# Patient Record
Sex: Male | Born: 1985
Health system: Southern US, Community
[De-identification: ages and names within clinical notes are randomized; demographics above are authoritative.]

## PROBLEM LIST (undated history)

## (undated) DIAGNOSIS — E559 Vitamin D deficiency, unspecified: Secondary | ICD-10-CM

## (undated) DIAGNOSIS — G4730 Sleep apnea, unspecified: Secondary | ICD-10-CM

## (undated) DIAGNOSIS — E7800 Pure hypercholesterolemia, unspecified: Secondary | ICD-10-CM

## (undated) HISTORY — DX: Sleep apnea, unspecified: G47.30

## (undated) HISTORY — PX: WISDOM TOOTH EXTRACTION: SHX21

---

## 2017-12-22 DIAGNOSIS — R509 Fever, unspecified: Secondary | ICD-10-CM | POA: Diagnosis not present

## 2017-12-22 DIAGNOSIS — J189 Pneumonia, unspecified organism: Secondary | ICD-10-CM | POA: Diagnosis not present

## 2017-12-22 DIAGNOSIS — R05 Cough: Secondary | ICD-10-CM | POA: Diagnosis not present

## 2018-08-30 DIAGNOSIS — J209 Acute bronchitis, unspecified: Secondary | ICD-10-CM | POA: Diagnosis not present

## 2018-08-30 DIAGNOSIS — J0110 Acute frontal sinusitis, unspecified: Secondary | ICD-10-CM | POA: Diagnosis not present

## 2019-03-08 DIAGNOSIS — Z20828 Contact with and (suspected) exposure to other viral communicable diseases: Secondary | ICD-10-CM | POA: Diagnosis not present

## 2019-05-27 ENCOUNTER — Emergency Department (HOSPITAL_BASED_OUTPATIENT_CLINIC_OR_DEPARTMENT_OTHER): Payer: BC Managed Care – PPO

## 2019-05-27 ENCOUNTER — Encounter (HOSPITAL_BASED_OUTPATIENT_CLINIC_OR_DEPARTMENT_OTHER): Payer: Self-pay

## 2019-05-27 ENCOUNTER — Emergency Department (HOSPITAL_BASED_OUTPATIENT_CLINIC_OR_DEPARTMENT_OTHER)
Admission: EM | Admit: 2019-05-27 | Discharge: 2019-05-27 | Disposition: A | Discharge status: Home / Self Care | Payer: BC Managed Care – PPO | Attending: Emergency Medicine | Admitting: Emergency Medicine

## 2019-05-27 ENCOUNTER — Other Ambulatory Visit: Payer: Self-pay

## 2019-05-27 DIAGNOSIS — Z20828 Contact with and (suspected) exposure to other viral communicable diseases: Secondary | ICD-10-CM | POA: Insufficient documentation

## 2019-05-27 DIAGNOSIS — Z03818 Encounter for observation for suspected exposure to other biological agents ruled out: Secondary | ICD-10-CM | POA: Diagnosis not present

## 2019-05-27 DIAGNOSIS — R1084 Generalized abdominal pain: Secondary | ICD-10-CM | POA: Diagnosis not present

## 2019-05-27 DIAGNOSIS — K760 Fatty (change of) liver, not elsewhere classified: Secondary | ICD-10-CM | POA: Diagnosis not present

## 2019-05-27 HISTORY — DX: Vitamin D deficiency, unspecified: E55.9

## 2019-05-27 HISTORY — DX: Pure hypercholesterolemia, unspecified: E78.00

## 2019-05-27 LAB — URINALYSIS, ROUTINE W REFLEX MICROSCOPIC
Bilirubin Urine: NEGATIVE
Glucose, UA: NEGATIVE mg/dL
Hgb urine dipstick: NEGATIVE
Ketones, ur: NEGATIVE mg/dL
Leukocytes,Ua: NEGATIVE
Nitrite: NEGATIVE
Protein, ur: NEGATIVE mg/dL
Specific Gravity, Urine: 1.02 (ref 1.005–1.030)
pH: 7.5 (ref 5.0–8.0)

## 2019-05-27 LAB — CBC WITH DIFFERENTIAL/PLATELET
Abs Immature Granulocytes: 0.02 10*3/uL (ref 0.00–0.07)
Basophils Absolute: 0 10*3/uL (ref 0.0–0.1)
Basophils Relative: 0 %
Eosinophils Absolute: 0 10*3/uL (ref 0.0–0.5)
Eosinophils Relative: 1 %
HCT: 44.6 % (ref 39.0–52.0)
Hemoglobin: 14.1 g/dL (ref 13.0–17.0)
Immature Granulocytes: 0 %
Lymphocytes Relative: 12 %
Lymphs Abs: 0.8 10*3/uL (ref 0.7–4.0)
MCH: 27.3 pg (ref 26.0–34.0)
MCHC: 31.6 g/dL (ref 30.0–36.0)
MCV: 86.3 fL (ref 80.0–100.0)
Monocytes Absolute: 0.4 10*3/uL (ref 0.1–1.0)
Monocytes Relative: 6 %
Neutro Abs: 5.1 10*3/uL (ref 1.7–7.7)
Neutrophils Relative %: 81 %
Platelets: 241 10*3/uL (ref 150–400)
RBC: 5.17 MIL/uL (ref 4.22–5.81)
RDW: 13.5 % (ref 11.5–15.5)
WBC: 6.3 10*3/uL (ref 4.0–10.5)
nRBC: 0 % (ref 0.0–0.2)

## 2019-05-27 LAB — COMPREHENSIVE METABOLIC PANEL
ALT: 44 U/L (ref 0–44)
AST: 23 U/L (ref 15–41)
Albumin: 4.4 g/dL (ref 3.5–5.0)
Alkaline Phosphatase: 57 U/L (ref 38–126)
Anion gap: 11 (ref 5–15)
BUN: 18 mg/dL (ref 6–20)
CO2: 26 mmol/L (ref 22–32)
Calcium: 9.5 mg/dL (ref 8.9–10.3)
Chloride: 101 mmol/L (ref 98–111)
Creatinine, Ser: 1.26 mg/dL — ABNORMAL HIGH (ref 0.61–1.24)
GFR calc Af Amer: >60 mL/min (ref >60)
GFR calc non Af Amer: >60 mL/min (ref >60)
Glucose, Bld: 111 mg/dL — ABNORMAL HIGH (ref 70–99)
Potassium: 3.9 mmol/L (ref 3.5–5.1)
Sodium: 138 mmol/L (ref 135–145)
Total Bilirubin: 0.6 mg/dL (ref 0.3–1.2)
Total Protein: 7.8 g/dL (ref 6.5–8.1)

## 2019-05-27 LAB — SARS CORONAVIRUS 2 (TAT 6-24 HRS): SARS Coronavirus 2: NEGATIVE

## 2019-05-27 LAB — LIPASE, BLOOD: Lipase: 18 U/L (ref 11–51)

## 2019-05-27 MED ORDER — SODIUM CHLORIDE 0.9 % IV BOLUS
1000.0000 mL | Freq: Once | INTRAVENOUS | Status: AC
  Administered 2019-05-27: 1000 mL via INTRAVENOUS

## 2019-05-27 MED ORDER — DICYCLOMINE HCL 20 MG PO TABS: 20.0000 mg | ORAL_TABLET | Freq: Two times a day (BID) | ORAL | 0 refills | Status: DC

## 2019-05-27 MED ORDER — IOHEXOL 300 MG/ML  SOLN
100.0000 mL | Freq: Once | INTRAMUSCULAR | Status: AC | PRN
  Administered 2019-05-27: 15:00:00 100 mL via INTRAVENOUS

## 2019-05-27 MED ORDER — ONDANSETRON 4 MG PO TBDP: 4.0000 mg | ORAL_TABLET | Freq: Three times a day (TID) | ORAL | 0 refills | Status: DC | PRN

## 2019-05-27 MED ORDER — MORPHINE SULFATE (PF) 4 MG/ML IV SOLN
4.0000 mg | Freq: Once | INTRAVENOUS | Status: AC
  Administered 2019-05-27: 4 mg via INTRAVENOUS
  Filled 2019-05-27: qty 1

## 2019-05-27 MED ORDER — ONDANSETRON HCL 4 MG/2ML IJ SOLN
4.0000 mg | Freq: Once | INTRAMUSCULAR | Status: AC
  Administered 2019-05-27: 13:00:00 4 mg via INTRAVENOUS
  Filled 2019-05-27: qty 2

## 2019-05-27 MED FILL — DICYCLOMINE 20 MG TABLET: 20 | 10 days supply | Qty: 20 | Fill #0

## 2019-05-27 MED FILL — ONDANSETRON ODT 4 MG TABLET: 4 | 3 days supply | Qty: 10 | Fill #0

## 2019-05-27 NOTE — ED Triage Notes (Signed)
C/o abd pain to umbilicus area-nausea, diarrhea-sx started  8/28-NAD-steady gait

## 2019-05-27 NOTE — ED Notes (Signed)
ED Provider at bedside. 

## 2019-05-27 NOTE — ED Provider Notes (Signed)
[facility] EMERGENCY DEPARTMENT Provider Note   CSN: [id number] Arrival date & time: 05/27/19  1247     History   Chief Complaint Chief Complaint  Patient presents with  . Abdominal Pain    HPI Adrian Torres is a 33 y.o. male.     Pt presents to the ED today with abdominal pain.  Sx started on Friday, August 28.  It was not too bad on the 29th, but came back on the 30th and is worse today.  He has nausea, but no vomiting.  No fever.  He took an imodium today which made it worse.     Past Medical History:  Diagnosis Date  . High cholesterol   . Vitamin D deficiency     There are no active problems to display for this patient.   History reviewed. No pertinent surgical history.      Home Medications    Prior to Admission medications   Medication Sig Start Date End Date Taking? Authorizing Provider  dicyclomine (BENTYL) 20 MG tablet Take 1 tablet (20 mg total) by mouth 2 (two) times daily. 05/27/19   Jacalyn LefevreHaviland, Yousif Edelson, MD  ondansetron (ZOFRAN ODT) 4 MG disintegrating tablet Take 1 tablet (4 mg total) by mouth every 8 (eight) hours as needed. 05/27/19   Jacalyn LefevreHaviland, Remmy Riffe, MD    Family History No family history on file.  Social History Social History   Tobacco Use  . Smoking status: Never Smoker  . Smokeless tobacco: Never Used  Substance Use Topics  . Alcohol use: Never    Frequency: Never  . Drug use: Never     Allergies   Penicillins   Review of Systems Review of Systems  Gastrointestinal: Positive for abdominal pain and nausea.  All other systems reviewed and are negative.    Physical Exam Updated Vital Signs BP 120/88 (BP Location: Left Arm)   Pulse 88   Temp 97.8 F (36.6 C) (Oral)   Resp 18   Ht 5\' 10"  (1.778 m)   Wt 115.2 kg   SpO2 100%   BMI 36.45 kg/m   Physical Exam Vitals signs and nursing note reviewed.  Constitutional:      Appearance: He is well-developed.  HENT:     Head: Normocephalic and atraumatic.   Mouth/Throat:     Mouth: Mucous membranes are moist.     Pharynx: Oropharynx is clear.  Eyes:     Extraocular Movements: Extraocular movements intact.     Pupils: Pupils are equal, round, and reactive to light.  Cardiovascular:     Rate and Rhythm: Normal rate and regular rhythm.     Heart sounds: Normal heart sounds.  Pulmonary:     Effort: Pulmonary effort is normal.     Breath sounds: Normal breath sounds.  Abdominal:     General: Abdomen is flat.     Palpations: Abdomen is soft.     Tenderness: There is abdominal tenderness in the periumbilical area.  Skin:    General: Skin is warm.     Capillary Refill: Capillary refill takes less than 2 seconds.  Neurological:     General: No focal deficit present.     Mental Status: He is alert and oriented to person, place, and time.  Psychiatric:        Mood and Affect: Mood normal.        Behavior: Behavior normal.      ED Treatments / Results  Labs (all labs ordered are listed, but only abnormal  results are displayed) Labs Reviewed  COMPREHENSIVE METABOLIC PANEL - Abnormal; Notable for the following components:      Result Value   Glucose, Bld 111 (*)    Creatinine, Ser 1.26 (*)    All other components within normal limits  SARS CORONAVIRUS 2 (TAT 6-24 HRS)  CBC WITH DIFFERENTIAL/PLATELET  LIPASE, BLOOD  URINALYSIS, ROUTINE W REFLEX MICROSCOPIC    EKG None  Radiology Ct Abdomen Pelvis W Contrast  Result Date: 05/27/2019 CLINICAL DATA:  Generalized abdominal pain nausea EXAM: CT ABDOMEN AND PELVIS WITH CONTRAST TECHNIQUE: Multidetector CT imaging of the abdomen and pelvis was performed using the standard protocol following bolus administration of intravenous contrast. CONTRAST:  168mL OMNIPAQUE IOHEXOL 300 MG/ML  SOLN COMPARISON:  None. FINDINGS: Lower chest: No acute abnormality. Hepatobiliary: Heterogenous decreased attenuation consistent with fatty infiltration. Sparing near the gallbladder fossa. Small amount of density  within the gallbladder lumen without calcified stone. Pancreas: Unremarkable. No pancreatic ductal dilatation or surrounding inflammatory changes. Spleen: Normal in size without focal abnormality. Adrenals/Urinary Tract: Adrenal glands are unremarkable. Kidneys are normal, without renal calculi, focal lesion, or hydronephrosis. Bladder is unremarkable. Stomach/Bowel: Stomach is within normal limits. Appendix appears normal. No evidence of bowel wall thickening, distention, or inflammatory changes. Vascular/Lymphatic: No significant vascular findings are present. No enlarged abdominal or pelvic lymph nodes. Reproductive: Prostate is unremarkable. Other: No abdominal wall hernia or abnormality. No abdominopelvic ascites. Musculoskeletal: No acute or significant osseous findings. IMPRESSION: 1. No CT evidence for acute intra-abdominal or pelvic abnormality. 2. Hepatic steatosis with sparing near the gallbladder fossa 3. Ovoid noncalcified density within the gallbladder, possible sludge versus polyp. Ultrasound could be obtained for further characterisation, this could be performed on a nonemergent basis if no clinical suspicion for acute gallbladder disease. Electronically Signed   By: Donavan Foil M.D.   On: 05/27/2019 14:49    Procedures Procedures (including critical care time)  Medications Ordered in ED Medications  morphine 4 MG/ML injection 4 mg (4 mg Intravenous Given 05/27/19 1326)  ondansetron (ZOFRAN) injection 4 mg (4 mg Intravenous Given 05/27/19 1326)  sodium chloride 0.9 % bolus 1,000 mL (0 mLs Intravenous Stopped 05/27/19 1429)  iohexol (OMNIPAQUE) 300 MG/ML solution 100 mL (100 mLs Intravenous Contrast Given 05/27/19 1433)     Initial Impression / Assessment and Plan / ED Course  I have reviewed the triage vital signs and the nursing notes.  Pertinent labs & imaging results that were available during my care of the patient were reviewed by me and considered in my medical decision making (see  chart for details).     Pt is feeling much better.  He is swabbed for Covid prior to d/c.  Return if worse.    Final Clinical Impressions(s) / ED Diagnoses   Final diagnoses:  Generalized abdominal pain    ED Discharge Orders         Ordered    ondansetron (ZOFRAN ODT) 4 MG disintegrating tablet  Every 8 hours PRN     05/27/19 1508    dicyclomine (BENTYL) 20 MG tablet  2 times daily     05/27/19 1508           Isla Pence, MD 05/27/19 [phone]

## 2019-05-30 ENCOUNTER — Other Ambulatory Visit: Payer: Self-pay

## 2019-05-30 ENCOUNTER — Emergency Department (HOSPITAL_COMMUNITY)
Admission: EM | Admit: 2019-05-30 | Discharge: 2019-05-31 | Disposition: A | Discharge status: Home / Self Care | Payer: BC Managed Care – PPO | Attending: Emergency Medicine | Admitting: Emergency Medicine

## 2019-05-30 ENCOUNTER — Encounter (HOSPITAL_COMMUNITY): Payer: Self-pay

## 2019-05-30 ENCOUNTER — Emergency Department (HOSPITAL_COMMUNITY): Payer: BC Managed Care – PPO

## 2019-05-30 DIAGNOSIS — R079 Chest pain, unspecified: Secondary | ICD-10-CM | POA: Diagnosis not present

## 2019-05-30 DIAGNOSIS — R1011 Right upper quadrant pain: Secondary | ICD-10-CM

## 2019-05-30 DIAGNOSIS — R1084 Generalized abdominal pain: Secondary | ICD-10-CM | POA: Diagnosis not present

## 2019-05-30 DIAGNOSIS — K8020 Calculus of gallbladder without cholecystitis without obstruction: Secondary | ICD-10-CM | POA: Insufficient documentation

## 2019-05-30 DIAGNOSIS — K760 Fatty (change of) liver, not elsewhere classified: Secondary | ICD-10-CM | POA: Diagnosis not present

## 2019-05-30 LAB — COMPREHENSIVE METABOLIC PANEL
ALT: 42 U/L (ref 0–44)
AST: 20 U/L (ref 15–41)
Albumin: 4.7 g/dL (ref 3.5–5.0)
Alkaline Phosphatase: 61 U/L (ref 38–126)
Anion gap: 10 (ref 5–15)
BUN: 21 mg/dL — ABNORMAL HIGH (ref 6–20)
CO2: 26 mmol/L (ref 22–32)
Calcium: 9.1 mg/dL (ref 8.9–10.3)
Chloride: 101 mmol/L (ref 98–111)
Creatinine, Ser: 1.1 mg/dL (ref 0.61–1.24)
GFR calc Af Amer: >60 mL/min (ref >60)
GFR calc non Af Amer: >60 mL/min (ref >60)
Glucose, Bld: 100 mg/dL — ABNORMAL HIGH (ref 70–99)
Potassium: 3.4 mmol/L — ABNORMAL LOW (ref 3.5–5.1)
Sodium: 137 mmol/L (ref 135–145)
Total Bilirubin: 0.9 mg/dL (ref 0.3–1.2)
Total Protein: 8.2 g/dL — ABNORMAL HIGH (ref 6.5–8.1)

## 2019-05-30 LAB — CBC
HCT: 44.4 % (ref 39.0–52.0)
Hemoglobin: 14.2 g/dL (ref 13.0–17.0)
MCH: 27.6 pg (ref 26.0–34.0)
MCHC: 32 g/dL (ref 30.0–36.0)
MCV: 86.2 fL (ref 80.0–100.0)
Platelets: 266 10*3/uL (ref 150–400)
RBC: 5.15 MIL/uL (ref 4.22–5.81)
RDW: 13.3 % (ref 11.5–15.5)
WBC: 6.5 10*3/uL (ref 4.0–10.5)
nRBC: 0 % (ref 0.0–0.2)

## 2019-05-30 LAB — LIPASE, BLOOD: Lipase: 17 U/L (ref 11–51)

## 2019-05-30 MED ORDER — FENTANYL CITRATE (PF) 100 MCG/2ML IJ SOLN
100.0000 ug | Freq: Once | INTRAMUSCULAR | Status: AC
  Administered 2019-05-31: 100 ug via INTRAVENOUS
  Filled 2019-05-30: qty 2

## 2019-05-30 MED ORDER — SODIUM CHLORIDE 0.9% FLUSH
3.0000 mL | Freq: Once | INTRAVENOUS | Status: AC
  Administered 2019-05-31: 3 mL via INTRAVENOUS

## 2019-05-30 MED ORDER — ONDANSETRON HCL 4 MG/2ML IJ SOLN
4.0000 mg | Freq: Once | INTRAMUSCULAR | Status: AC
  Administered 2019-05-31: 4 mg via INTRAVENOUS
  Filled 2019-05-30: qty 2

## 2019-05-30 NOTE — ED Triage Notes (Signed)
Pt reports that he was dx'd with gallbladder sludge earlier this week. He reports continued abdominal pain and states that now the pain has migrated to his epigastric region. Denies vomiting, but endorses nausea. Reports diarrhea all week. A&Ox4.

## 2019-05-31 DIAGNOSIS — K760 Fatty (change of) liver, not elsewhere classified: Secondary | ICD-10-CM | POA: Diagnosis not present

## 2019-05-31 DIAGNOSIS — K8020 Calculus of gallbladder without cholecystitis without obstruction: Secondary | ICD-10-CM | POA: Diagnosis not present

## 2019-05-31 MED ORDER — ONDANSETRON 8 MG PO TBDP: 8.0000 mg | ORAL_TABLET | Freq: Three times a day (TID) | ORAL | 0 refills | Status: DC | PRN

## 2019-05-31 MED ORDER — HYDROCODONE-ACETAMINOPHEN 5-325 MG PO TABS: 1.0000 | ORAL_TABLET | Freq: Four times a day (QID) | ORAL | 0 refills | Status: DC | PRN

## 2019-05-31 NOTE — ED Notes (Signed)
US in room 

## 2019-05-31 NOTE — ED Provider Notes (Signed)
[facility] DEPT Provider Note   CSN: [id number] Arrival date & time: 05/30/19  1921     History   Chief Complaint Chief Complaint  Patient presents with  . Abdominal Pain    HPI Adrian Torres is a 33 y.o. male.     The history is provided by the patient.  Abdominal Pain Pain location:  Generalized Pain quality: aching   Pain severity:  Moderate Onset quality:  Gradual Duration:  7 days Timing:  Constant Progression:  Worsening Chronicity:  New Relieved by:  Nothing Worsened by:  Movement and palpation Associated symptoms: diarrhea and nausea   Associated symptoms: no fever and no hematochezia   Risk factors: has not had multiple surgeries   Patient reports pain started a week ago.  He reports loose diarrhea that is nonbloody.  No fevers.  No recent travel.  No insect bites.  No Camping. Seen in ER recently had a CT scan and told he had gallbladder sludge.  Reports pain continued since that time. Past Medical History:  Diagnosis Date  . High cholesterol   . Vitamin D deficiency     There are no active problems to display for this patient.   History reviewed. No pertinent surgical history.      Home Medications    Prior to Admission medications   Medication Sig Start Date End Date Taking? Authorizing Provider  calcium carbonate (TUMS - DOSED IN MG ELEMENTAL CALCIUM) 500 MG chewable tablet Chew 1 tablet by mouth daily as needed for indigestion or heartburn.   Yes [provider]  dicyclomine (BENTYL) 20 MG tablet Take 1 tablet (20 mg total) by mouth 2 (two) times daily. 05/27/19  Yes Isla Pence, MD  Misc Natural Products (APPLE CIDER VINEGAR DIET PO) Take 1 tablet by mouth daily.   Yes [provider]  ondansetron (ZOFRAN ODT) 4 MG disintegrating tablet Take 1 tablet (4 mg total) by mouth every 8 (eight) hours as needed. Patient taking differently: Take 4 mg by mouth every 8 (eight) hours as needed for nausea  or vomiting.  05/27/19  Yes Isla Pence, MD    Family History History reviewed. No pertinent family history.  Social History Social History   Tobacco Use  . Smoking status: Never Smoker  . Smokeless tobacco: Never Used  Substance Use Topics  . Alcohol use: Never    Frequency: Never  . Drug use: Never     Allergies   Penicillins   Review of Systems Review of Systems  Constitutional: Negative for fever.  Cardiovascular:       Brief chest pain  Gastrointestinal: Positive for abdominal pain, diarrhea and nausea. Negative for blood in stool and hematochezia.  All other systems reviewed and are negative.    Physical Exam Updated Vital Signs BP 116/81   Pulse 87   Temp 98.5 F (36.9 C) (Oral)   Resp 18   SpO2 99%   Physical Exam CONSTITUTIONAL: Well developed/well nourished HEAD: Normocephalic/atraumatic EYES: EOMI/PERRL, no icterus ENMT: Mucous membranes moist NECK: supple no meningeal signs SPINE/BACK:entire spine nontender CV: S1/S2 noted, no murmurs/rubs/gallops noted LUNGS: Lungs are clear to auscultation bilaterally, no apparent distress ABDOMEN: soft, moderate RUQ tenderness, no rebound or guarding, bowel sounds noted throughout abdomen GU:no cva tenderness NEURO: Pt is awake/alert/appropriate, moves all extremitiesx4.  No facial droop.   EXTREMITIES: pulses normal/equal, full ROM SKIN: warm, color normal PSYCH: no abnormalities of mood noted, alert and oriented to situation   ED Treatments / Results  Labs (all labs ordered are listed, but only abnormal results are displayed) Labs Reviewed  COMPREHENSIVE METABOLIC PANEL - Abnormal; Notable for the following components:      Result Value   Potassium 3.4 (*)    Glucose, Bld 100 (*)    BUN 21 (*)    Total Protein 8.2 (*)    All other components within normal limits  LIPASE, BLOOD  CBC  URINALYSIS, ROUTINE W REFLEX MICROSCOPIC    EKG EKG Interpretation  Date/Time:  Saturday May 31 2019  00:23:12 EDT Ventricular Rate:  63 PR Interval:    QRS Duration: 83 QT Interval:  403 QTC Calculation: 413 R Axis:   60 Text Interpretation:  Sinus rhythm ST elev, probable normal early repol pattern No previous ECGs available Confirmed by Zadie RhineWickline, Qadir Folks ([id number]) on 05/31/2019 12:43:26 AM   Radiology [place] Abdomen Limited Ruq  Result Date: 05/31/2019 CLINICAL DATA:  Right upper quadrant pain EXAM: ULTRASOUND ABDOMEN LIMITED RIGHT UPPER QUADRANT COMPARISON:  CT 05/27/2019 FINDINGS: Gallbladder: 1.6 cm mobile gallstone within the gallbladder. No wall thickening or sonographic Murphy's sign. Common bile duct: Diameter: Normal caliber, 4 mm Liver: Increased echotexture compatible with fatty infiltration. No focal abnormality or biliary ductal dilatation. Portal vein is patent on color Doppler imaging with normal direction of blood flow towards the liver. Other: None. IMPRESSION: Fatty infiltration of the liver. Cholelithiasis.  No sonographic evidence of acute cholecystitis. Electronically Signed   By: Charlett NoseKevin  Dover M.D.   On: 05/31/2019 00:21    Procedures Procedures    Medications Ordered in ED Medications  sodium chloride flush (NS) 0.9 % injection 3 mL (3 mLs Intravenous Given 05/31/19 0019)  fentaNYL (SUBLIMAZE) injection 100 mcg (100 mcg Intravenous Given 05/31/19 0018)  ondansetron (ZOFRAN) injection 4 mg (4 mg Intravenous Given 05/31/19 0018)     Initial Impression / Assessment and Plan / ED Course  I have reviewed the triage vital signs and the nursing notes.  Pertinent labs   results that were available during my care of the patient were reviewed by me and considered in my medical decision making (see chart for details).        Patient found to have cholelithiasis.  He is improved.  Labs reassuring.  He feels comfortable for discharge home.  Will refer to general surgery.  Discussed dietary modifications and strict ER return precautions  Final Clinical Impressions(s) / ED Diagnoses    Final diagnoses:  RUQ pain  Calculus of gallbladder without cholecystitis without obstruction    ED Discharge Orders         Ordered    HYDROcodone-acetaminophen (NORCO/VICODIN) 5-325 MG tablet  Every 6 hours PRN     05/31/19 0124    ondansetron (ZOFRAN ODT) 8 MG disintegrating tablet  Every 8 hours PRN     05/31/19 0124           Zadie RhineWickline, Trystyn Sitts, MD 05/31/19 [phone]

## 2019-06-04 DIAGNOSIS — G4730 Sleep apnea, unspecified: Secondary | ICD-10-CM | POA: Diagnosis not present

## 2019-06-04 DIAGNOSIS — E6609 Other obesity due to excess calories: Secondary | ICD-10-CM | POA: Diagnosis not present

## 2019-06-04 DIAGNOSIS — B353 Tinea pedis: Secondary | ICD-10-CM | POA: Diagnosis not present

## 2019-06-17 DIAGNOSIS — G4733 Obstructive sleep apnea (adult) (pediatric): Secondary | ICD-10-CM | POA: Diagnosis not present

## 2019-07-04 DIAGNOSIS — G4730 Sleep apnea, unspecified: Secondary | ICD-10-CM | POA: Diagnosis not present

## 2019-07-11 DIAGNOSIS — H5213 Myopia, bilateral: Secondary | ICD-10-CM | POA: Diagnosis not present

## 2019-08-02 DIAGNOSIS — Z20828 Contact with and (suspected) exposure to other viral communicable diseases: Secondary | ICD-10-CM | POA: Diagnosis not present

## 2019-08-19 DIAGNOSIS — Z20828 Contact with and (suspected) exposure to other viral communicable diseases: Secondary | ICD-10-CM | POA: Diagnosis not present

## 2019-10-08 DIAGNOSIS — Z20828 Contact with and (suspected) exposure to other viral communicable diseases: Secondary | ICD-10-CM | POA: Diagnosis not present

## 2020-01-13 DIAGNOSIS — G4730 Sleep apnea, unspecified: Secondary | ICD-10-CM | POA: Diagnosis not present

## 2020-01-13 DIAGNOSIS — Z8616 Personal history of COVID-19: Secondary | ICD-10-CM | POA: Diagnosis not present

## 2020-01-13 DIAGNOSIS — B353 Tinea pedis: Secondary | ICD-10-CM | POA: Diagnosis not present

## 2020-04-20 DIAGNOSIS — B353 Tinea pedis: Secondary | ICD-10-CM | POA: Diagnosis not present

## 2020-04-20 DIAGNOSIS — G4730 Sleep apnea, unspecified: Secondary | ICD-10-CM | POA: Diagnosis not present

## 2020-04-28 IMAGING — CT CT ABD-PELV W/ CM
2 of 4 series · 16 of 46 positions shown, 18 images · IV contrast (APPLIED)
Comparison: None.

CLINICAL DATA: Generalized abdominal pain nausea

EXAM:
CT ABDOMEN AND PELVIS WITH CONTRAST
TECHNIQUE: Multidetector CT imaging of the abdomen and pelvis was performed
using the standard protocol following bolus administration of
intravenous contrast.
CONTRAST:  100mL OMNIPAQUE IOHEXOL 300 MG/ML  SOLN

[Series 2: axial st · axial · 0.98mm/px · z∈[-664,-124]mm · 13 of 118 slices shown, 15 images]
[im 5/118  soft-tissue]
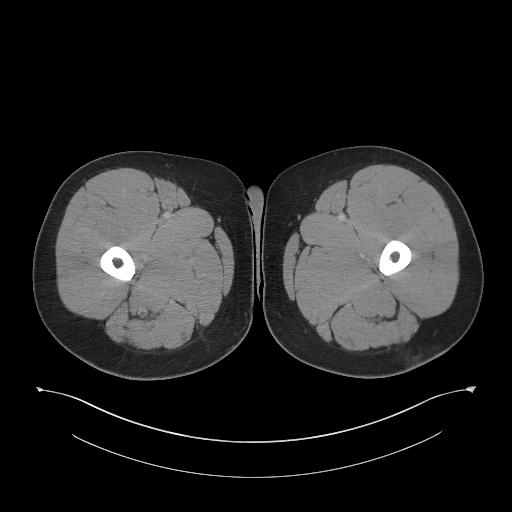
[im 5/118  bone]
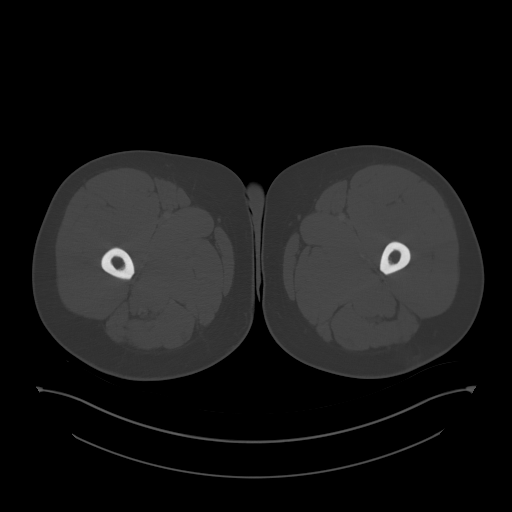
[im 15/118  soft-tissue]
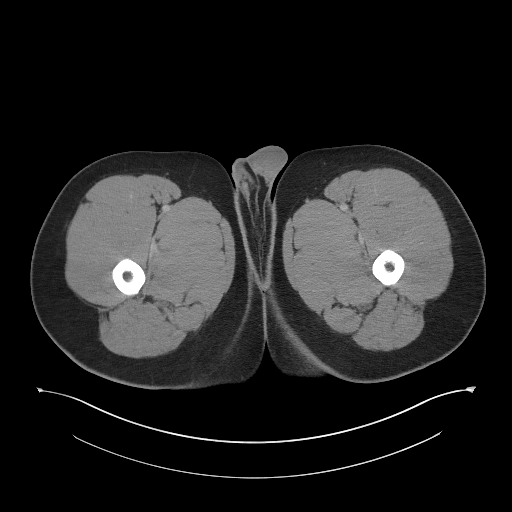
[im 25/118  soft-tissue]
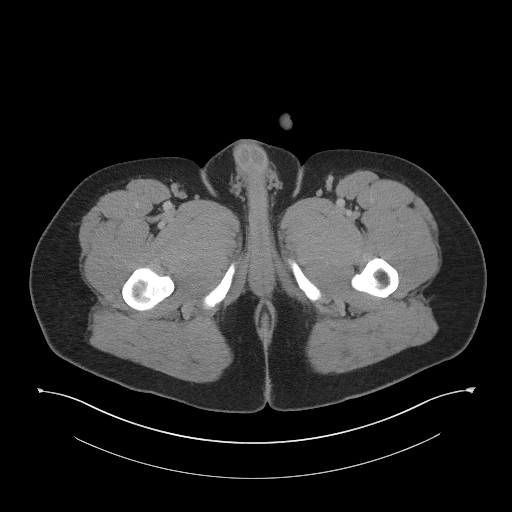
[im 35/118  soft-tissue]
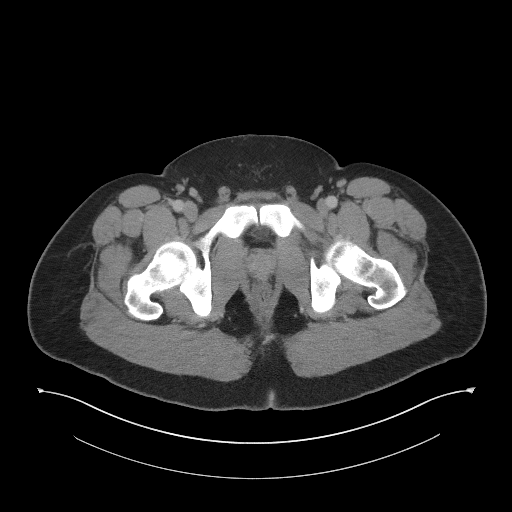
[im 40/118  soft-tissue]
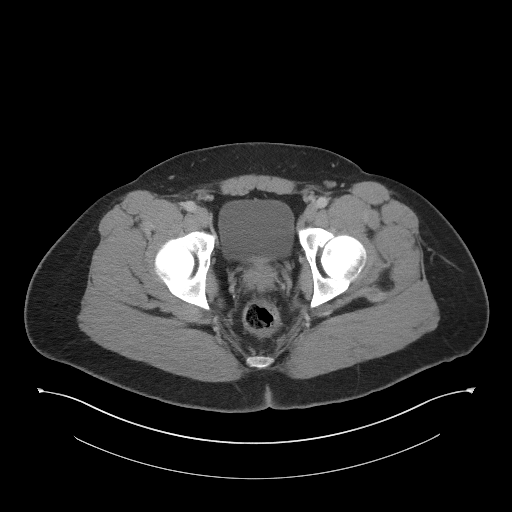
[im 49/118  soft-tissue]
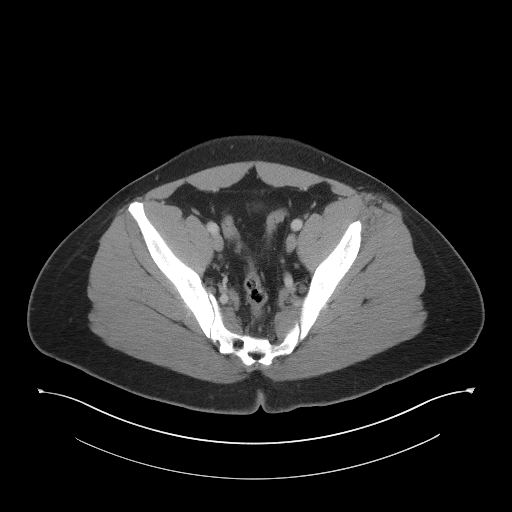
[im 59/118  soft-tissue]
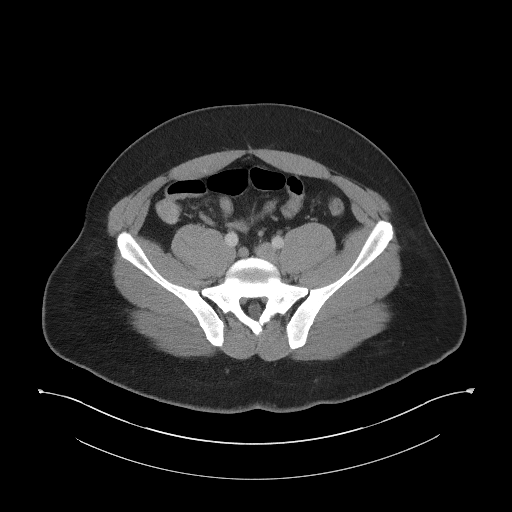
[im 69/118  soft-tissue]
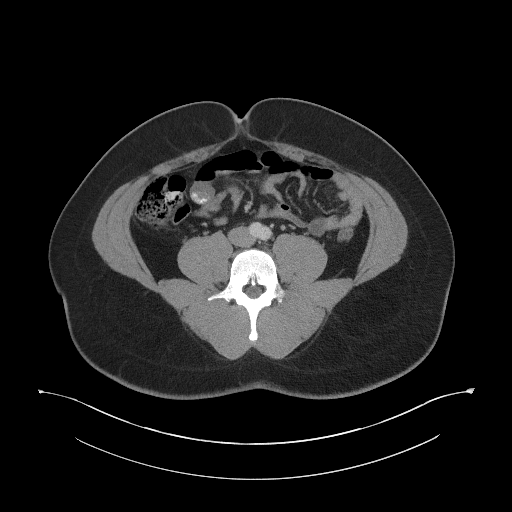
[im 79/118  soft-tissue]
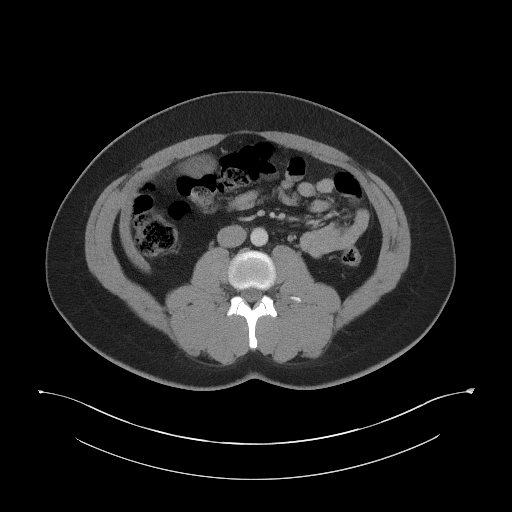
[im 79/118  bone]
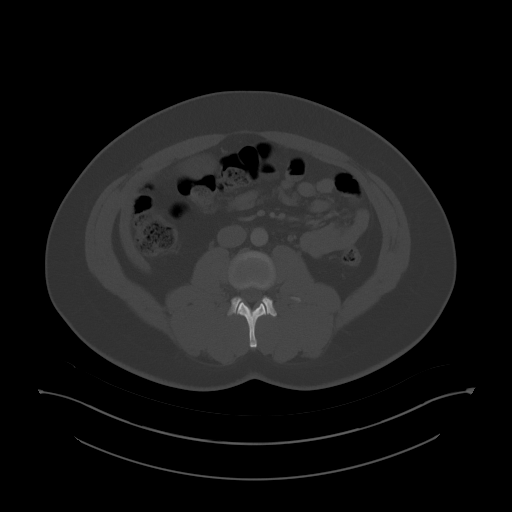
[im 83/118  soft-tissue]
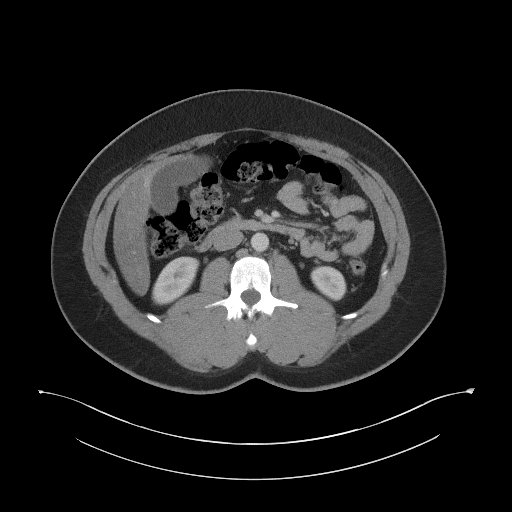
[im 93/118  soft-tissue]
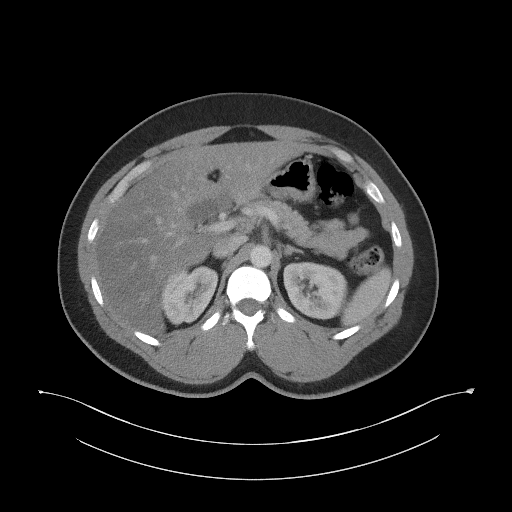
[im 103/118  soft-tissue]
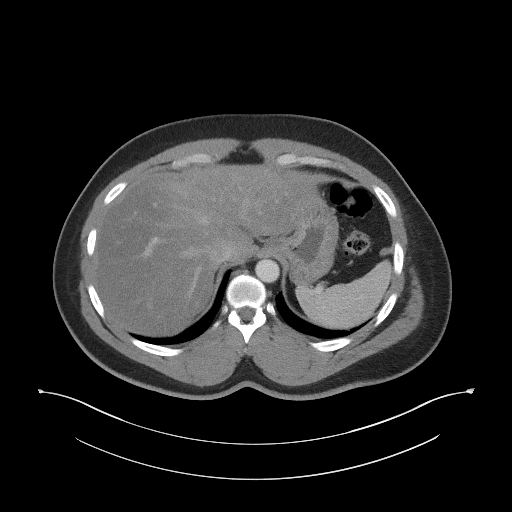
[im 113/118  soft-tissue]
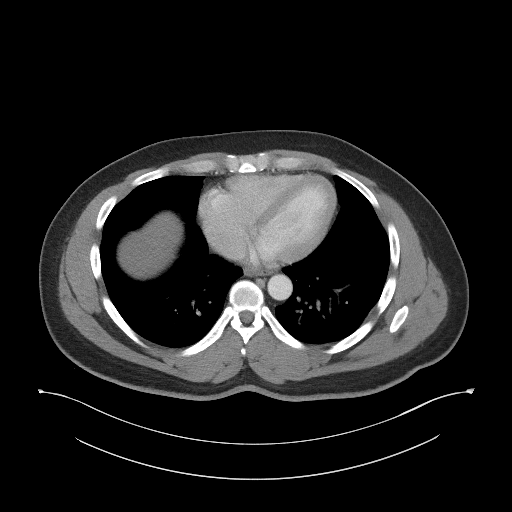

[Series 4: coronal st · coronal · 0.98mm/px · 3 of 105 slices shown]
[im 35/105  soft-tissue]
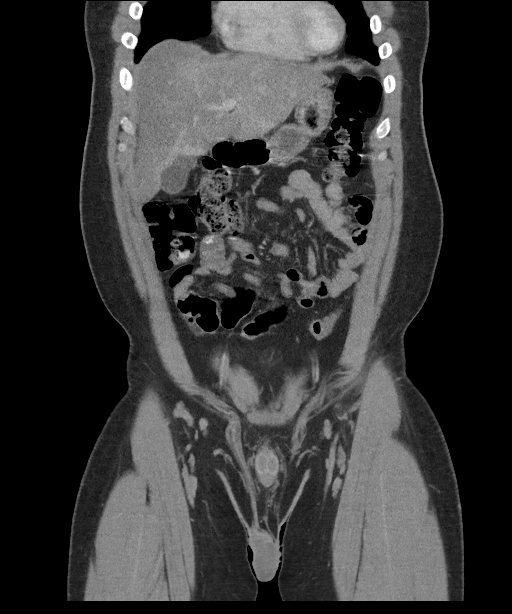
[im 47/105  soft-tissue]
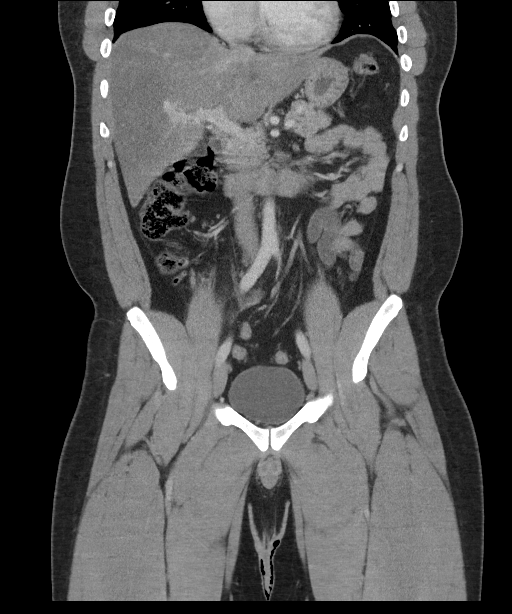
[im 58/105  soft-tissue]
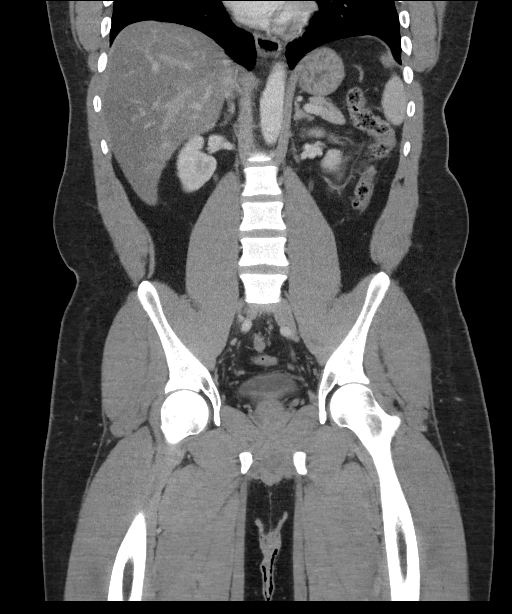

[16 of 46 positions shown; findings below may reference images not displayed]

FINDINGS: Lower chest: No acute abnormality.

Hepatobiliary: Heterogenous decreased attenuation consistent with
fatty infiltration. Sparing near the gallbladder fossa. Small amount
of density within the gallbladder lumen without calcified stone.

Pancreas: Unremarkable. No pancreatic ductal dilatation or
surrounding inflammatory changes.

Spleen: Normal in size without focal abnormality.

Adrenals/Urinary Tract: Adrenal glands are unremarkable. Kidneys are
normal, without renal calculi, focal lesion, or hydronephrosis.
Bladder is unremarkable.

Stomach/Bowel: Stomach is within normal limits. Appendix appears
normal. No evidence of bowel wall thickening, distention, or
inflammatory changes.

Vascular/Lymphatic: No significant vascular findings are present. No
enlarged abdominal or pelvic lymph nodes.

Reproductive: Prostate is unremarkable.

Other: No abdominal wall hernia or abnormality. No abdominopelvic
ascites.

Musculoskeletal: No acute or significant osseous findings.
IMPRESSION: 1. No CT evidence for acute intra-abdominal or pelvic abnormality.
2. Hepatic steatosis with sparing near the gallbladder fossa
3. Ovoid noncalcified density within the gallbladder, possible
sludge versus polyp. Ultrasound could be obtained for further
characterisation, this could be performed on a nonemergent basis if
no clinical suspicion for acute gallbladder disease.

## 2020-05-28 ENCOUNTER — Other Ambulatory Visit: Payer: BC Managed Care – PPO

## 2020-06-02 DIAGNOSIS — Z20822 Contact with and (suspected) exposure to covid-19: Secondary | ICD-10-CM | POA: Diagnosis not present

## 2020-06-02 DIAGNOSIS — Z03818 Encounter for observation for suspected exposure to other biological agents ruled out: Secondary | ICD-10-CM | POA: Diagnosis not present

## 2020-07-22 DIAGNOSIS — Z20822 Contact with and (suspected) exposure to covid-19: Secondary | ICD-10-CM | POA: Diagnosis not present

## 2020-07-22 DIAGNOSIS — Z03818 Encounter for observation for suspected exposure to other biological agents ruled out: Secondary | ICD-10-CM | POA: Diagnosis not present

## 2021-04-13 DIAGNOSIS — Z20822 Contact with and (suspected) exposure to covid-19: Secondary | ICD-10-CM | POA: Diagnosis not present

## 2021-04-16 DIAGNOSIS — Z20822 Contact with and (suspected) exposure to covid-19: Secondary | ICD-10-CM | POA: Diagnosis not present

## 2021-06-03 ENCOUNTER — Ambulatory Visit (INDEPENDENT_AMBULATORY_CARE_PROVIDER_SITE_OTHER): Payer: BC Managed Care – PPO | Admitting: Family Medicine

## 2021-06-03 ENCOUNTER — Encounter: Payer: Self-pay | Admitting: Family Medicine

## 2021-06-03 ENCOUNTER — Other Ambulatory Visit: Payer: Self-pay

## 2021-06-03 VITALS — BP 118/79 | HR 71 | Temp 97.3°F | Resp 16 | Ht 71.0 in | Wt 252.2 lb

## 2021-06-03 DIAGNOSIS — R41840 Attention and concentration deficit: Secondary | ICD-10-CM

## 2021-06-03 DIAGNOSIS — G4733 Obstructive sleep apnea (adult) (pediatric): Secondary | ICD-10-CM | POA: Diagnosis not present

## 2021-06-03 DIAGNOSIS — Z7689 Persons encountering health services in other specified circumstances: Secondary | ICD-10-CM | POA: Diagnosis not present

## 2021-06-03 DIAGNOSIS — F341 Dysthymic disorder: Secondary | ICD-10-CM | POA: Diagnosis not present

## 2021-06-03 MED ORDER — ESCITALOPRAM OXALATE 10 MG PO TABS: 10.0000 mg | ORAL_TABLET | Freq: Every day | ORAL | 0 refills | Status: DC

## 2021-06-03 NOTE — Progress Notes (Signed)
New Patient Office Visit  Subjective:  Patient ID: Adrian Torres, male    DOB: July 14, 1986  Age: 35 y.o. MRN: [id number]  CC:  Chief Complaint  Patient presents with   Establish Care    HPI Adrian Torres presents for to establish care.  Patient reports that he has been diagnosed with sleep apnea and is in need of a CPAP machine.  Results of the most recent sleep study are not available at this time to me.  Patient also reports that he is having difficulty with concentrating.  He reports that he has had similar symptoms since he was younger.  He is presently on no meds.  He would like to be tested for attention deficits.  Past Medical History:  Diagnosis Date   High cholesterol    Sleep apnea    Vitamin D deficiency     Past Surgical History:  Procedure Laterality Date   WISDOM TOOTH EXTRACTION      Family History  Problem Relation Age of Onset   Diabetes Maternal Grandmother     Social History   Socioeconomic History   Marital status: Married    Spouse name: Not on file   Number of children: Not on file   Years of education: Not on file   Highest education level: Not on file  Occupational History   Not on file  Tobacco Use   Smoking status: Never   Smokeless tobacco: Never  Vaping Use   Vaping Use: Never used  Substance and Sexual Activity   Alcohol use: Never   Drug use: Never   Sexual activity: Yes  Other Topics Concern   Not on file  Social History Narrative   Not on file   Social Determinants of Health   Financial Resource Strain: Not on file  Food Insecurity: Not on file  Transportation Needs: Not on file  Physical Activity: Not on file  Stress: Not on file  Social Connections: Not on file  Intimate Partner Violence: Not on file    ROS Review of Systems  Respiratory:  Negative for shortness of breath.   Psychiatric/Behavioral:  Positive for decreased concentration. Negative for self-injury, sleep disturbance and suicidal ideas. The patient  is not nervous/anxious.   All other systems reviewed and are negative.  Objective:   Today's Vitals: BP 118/79 (BP Location: Left Arm, Patient Position: Sitting, Cuff Size: Large)   Pulse 71   Temp (!) 97.3 F (36.3 C) (Temporal)   Resp 16   Ht 5\' 11"  (1.803 m)   Wt 252 lb 3.2 oz (114.4 kg)   SpO2 97%   BMI 35.17 kg/m   Physical Exam Vitals and nursing note reviewed.  Constitutional:      General: He is not in acute distress. Cardiovascular:     Rate and Rhythm: Normal rate and regular rhythm.  Pulmonary:     Effort: Pulmonary effort is normal.     Breath sounds: Normal breath sounds.  Abdominal:     Palpations: Abdomen is soft.     Tenderness: There is no abdominal tenderness.  Neurological:     General: No focal deficit present.     Mental Status: He is alert and oriented to person, place, and time.    Assessment & Plan:    1. Dysthymia Patient prescribed Lexapro 10 mg p.o. daily. will monitor.  2. Obstructive sleep apnea syndrome Patient to obtain most recent sleep study in order that CPAP machine may be ordered.  3. Encounter to establish  care   4. Attention and concentration deficit Referral for further testing and evaluation. - Ambulatory referral to Psychology    Outpatient Encounter Medications as of 06/03/2021  Medication Sig   escitalopram (LEXAPRO) 10 MG tablet Take 1 tablet (10 mg total) by mouth daily.   calcium carbonate (TUMS - DOSED IN MG ELEMENTAL CALCIUM) 500 MG chewable tablet Chew 1 tablet by mouth daily as needed for indigestion or heartburn.   HYDROcodone-acetaminophen (NORCO/VICODIN) 5-325 MG tablet Take 1 tablet by mouth every 6 (six) hours as needed for severe pain.   Misc Natural Products (APPLE CIDER VINEGAR DIET PO) Take 1 tablet by mouth daily.   ondansetron (ZOFRAN ODT) 8 MG disintegrating tablet Take 1 tablet (8 mg total) by mouth every 8 (eight) hours as needed.   [DISCONTINUED] dicyclomine (BENTYL) 20 MG tablet Take 1 tablet (20  mg total) by mouth 2 (two) times daily.   No facility-administered encounter medications on file as of 06/03/2021.    Follow-up: Return in about 4 weeks (around 07/01/2021) for follow up.   Tommie Raymond, MD

## 2021-06-03 NOTE — Progress Notes (Signed)
Patient is here to est care with provider. Patient said he need a cpap machine Patient has a hard time focusing

## 2021-07-01 ENCOUNTER — Ambulatory Visit: Payer: BC Managed Care – PPO | Admitting: Family Medicine

## 2021-07-11 ENCOUNTER — Ambulatory Visit: Payer: BC Managed Care – PPO | Admitting: Family Medicine

## 2021-07-11 ENCOUNTER — Other Ambulatory Visit: Payer: Self-pay

## 2021-07-11 VITALS — BP 129/86 | HR 73 | Temp 98.1°F | Resp 16 | Wt 249.6 lb

## 2021-07-11 DIAGNOSIS — F341 Dysthymic disorder: Secondary | ICD-10-CM | POA: Diagnosis not present

## 2021-07-11 MED ORDER — ESCITALOPRAM OXALATE 10 MG PO TABS: 10.0000 mg | ORAL_TABLET | Freq: Every day | ORAL | 0 refills | Status: DC

## 2021-07-11 NOTE — Progress Notes (Signed)
Per patient everything is going pretty good, no issues

## 2021-07-12 ENCOUNTER — Encounter: Payer: Self-pay | Admitting: Family Medicine

## 2021-07-12 DIAGNOSIS — F341 Dysthymic disorder: Secondary | ICD-10-CM | POA: Insufficient documentation

## 2021-07-12 NOTE — Progress Notes (Signed)
   Established  Patient Office Visit  Subjective:  Patient ID: Adrian Torres, male    DOB: [birth year]  Age: 35 y.o. MRN: [id number]  CC:  Chief Complaint  Patient presents with   Follow-up    HPI Adrian Torres presents for follow up of depression. Patient reports that she does feel like the medicine is doing good but that she definitely is not taking it on a regular basis.  Past Medical History:  Diagnosis Date   High cholesterol    Sleep apnea    Vitamin D deficiency     Social History   Socioeconomic History   Marital status: Married    Spouse name: Not on file   Number of children: Not on file   Years of education: Not on file   Highest education level: Not on file  Occupational History   Not on file  Tobacco Use   Smoking status: Never   Smokeless tobacco: Never  Vaping Use   Vaping Use: Never used  Substance and Sexual Activity   Alcohol use: Never   Drug use: Never   Sexual activity: Yes  Other Topics Concern   Not on file  Social History Narrative   Not on file   Social Determinants of Health   Financial Resource Strain: Not on file  Food Insecurity: Not on file  Transportation Needs: Not on file  Physical Activity: Not on file  Stress: Not on file  Social Connections: Not on file  Intimate Partner Violence: Not on file    ROS Review of Systems  Psychiatric/Behavioral:  Positive for decreased concentration. Negative for self-injury, sleep disturbance and suicidal ideas. The patient is not nervous/anxious.   All other systems reviewed and are negative.  Objective:   Today's Vitals: BP 129/86   Pulse 73   Temp 98.1 F (36.7 C) (Oral)   Resp 16   Wt 249 lb 9.6 oz (113.2 kg)   SpO2 98%   BMI 34.81 kg/m   Physical Exam Vitals and nursing note reviewed.  Constitutional:      General: He is not in acute distress.    Appearance: He is obese.  Cardiovascular:     Rate and Rhythm: Normal rate and regular rhythm.  Pulmonary:     Effort:  Pulmonary effort is normal.     Breath sounds: Normal breath sounds.  Neurological:     General: No focal deficit present.     Mental Status: He is alert and oriented to person, place, and time.  Psychiatric:        Mood and Affect: Mood normal.        Behavior: Behavior normal.    Assessment & Plan:   1. Dysthymia Improving per patient. Discussed compliance. Will continue present management and monitor    Outpatient Encounter Medications as of 07/11/2021  Medication Sig   escitalopram (LEXAPRO) 10 MG tablet Take 1 tablet (10 mg total) by mouth daily.   [DISCONTINUED] dicyclomine (BENTYL) 20 MG tablet Take 1 tablet (20 mg total) by mouth 2 (two) times daily.   [DISCONTINUED] escitalopram (LEXAPRO) 10 MG tablet Take 1 tablet (10 mg total) by mouth daily.   No facility-administered encounter medications on file as of 07/11/2021.    Follow-up: Return in about 4 weeks (around 08/08/2021) for follow up.   Adrian Raymond, MD

## 2021-08-10 ENCOUNTER — Ambulatory Visit: Payer: BC Managed Care – PPO | Admitting: Family Medicine

## 2021-08-22 ENCOUNTER — Ambulatory Visit: Admission: EM | Admit: 2021-08-22 | Discharge: 2021-08-22 | Discharge status: Against Medical Advice | Payer: 59

## 2021-08-22 ENCOUNTER — Other Ambulatory Visit: Payer: Self-pay

## 2021-08-22 DIAGNOSIS — Z20828 Contact with and (suspected) exposure to other viral communicable diseases: Secondary | ICD-10-CM | POA: Diagnosis not present

## 2021-08-22 DIAGNOSIS — H6693 Otitis media, unspecified, bilateral: Secondary | ICD-10-CM | POA: Diagnosis not present

## 2021-08-22 DIAGNOSIS — J019 Acute sinusitis, unspecified: Secondary | ICD-10-CM | POA: Diagnosis not present

## 2021-08-27 ENCOUNTER — Other Ambulatory Visit: Payer: Self-pay | Admitting: Family Medicine

## 2021-08-31 ENCOUNTER — Ambulatory Visit: Payer: BC Managed Care – PPO | Admitting: Family Medicine

## 2021-12-15 ENCOUNTER — Other Ambulatory Visit: Payer: Self-pay | Admitting: Family Medicine

## 2022-05-20 ENCOUNTER — Encounter: Payer: Self-pay | Admitting: Pediatric Intensive Care

## 2022-05-20 NOTE — Progress Notes (Signed)
Recommend routine follow up with PCP as need for annual screens.
